# Patient Record
Sex: Male | Born: 1995
Health system: Southern US, Community
[De-identification: ages and names within clinical notes are randomized; demographics above are authoritative.]

## PROBLEM LIST (undated history)

## (undated) DIAGNOSIS — Z789 Other specified health status: Secondary | ICD-10-CM

## (undated) DIAGNOSIS — F329 Major depressive disorder, single episode, unspecified: Secondary | ICD-10-CM

## (undated) HISTORY — PX: HERNIA REPAIR: SHX51

## (undated) HISTORY — PX: INGUINAL HERNIA REPAIR: SUR1180

---
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
## 2017-03-24 ENCOUNTER — Encounter: Payer: Self-pay | Admitting: *Deleted

## 2017-03-24 ENCOUNTER — Emergency Department (INDEPENDENT_AMBULATORY_CARE_PROVIDER_SITE_OTHER)
Admission: EM | Admit: 2017-03-24 | Discharge: 2017-03-24 | Disposition: A | Discharge status: Home / Self Care | Payer: BLUE CROSS/BLUE SHIELD | Source: Home / Self Care | Attending: Family Medicine | Admitting: Family Medicine

## 2017-03-24 DIAGNOSIS — B354 Tinea corporis: Secondary | ICD-10-CM

## 2017-03-24 MED ORDER — KETOCONAZOLE 2 % EX CREA: 1.0000 "application " | TOPICAL_CREAM | Freq: Every day | CUTANEOUS | 0 refills | Status: DC

## 2017-03-24 NOTE — ED Triage Notes (Signed)
Pt c/o rash on his LT chest x 1- 2 mths.

## 2017-03-24 NOTE — ED Provider Notes (Signed)
[facility]    CSN: [id number] Arrival date & time: 03/24/17  1328     History   Chief Complaint Chief Complaint  Patient presents with  . Rash    HPI Tyler Lewis is a 21 y.o. male.   Patient complains of a slightly pruritic rash on his upper chest for about two months.  There was no response to an OTC anti-fungal spray.   The history is provided by the patient.  Rash  Location: anterior chest. Quality: dryness, itchiness and redness   Quality: not blistering, not bruising, not burning, not draining, not painful, not peeling, not scaling, not swelling and not weeping   Severity:  Mild Onset quality:  Gradual Duration:  2 months Timing:  Constant Progression:  Unchanged Chronicity:  New Context: not animal contact, not chemical exposure, not exposure to similar rash, not food, not hot tub use, not insect bite/sting, not medications, not new detergent/soap, not nuts and not plant contact   Relieved by:  Nothing Worsened by:  Nothing Ineffective treatments:  Anti-fungal cream Associated symptoms: no fatigue, no fever, no induration, no myalgias and no sore throat     Past Medical History:  Diagnosis Date  . Medical history non-contributory     Patient Active Problem List   Diagnosis Date Noted  . Anxiety and depression 08/29/2015  . Tear of right glenoid labrum 03/03/2015  . NEOPLASM UNCERTAIN BHV LIP ORAL CAVITY&PHARYNX 06/13/2011    Past Surgical History:  Procedure Laterality Date  . HERNIA REPAIR    . INGUINAL HERNIA REPAIR    . SHOULDER ARTHROSCOPY Right 07/10/2015   Procedure: Right diagnostic shoulder arthroscopy with capsular debridement;  Surgeon: Tania Ade, MD;  Location: [facility];  Service: Orthopedics;  Laterality: Right;       Home Medications    Prior to Admission medications   Medication Sig Start Date End Date Taking? Authorizing Provider  fexofenadine (ALLEGRA) 180 MG tablet Take 180 mg by mouth  daily.   Yes [provider]  ketoconazole (NIZORAL) 2 % cream Apply 1 application topically daily. 03/24/17   Kandra Nicolas, MD    Family History Family History  Problem Relation Age of Onset  . Hypertension Maternal Grandmother   . Heart disease Maternal Grandmother   . Hypertension Maternal Grandfather     Social History Social History  Substance Use Topics  . Smoking status: Never Smoker  . Smokeless tobacco: Never Used  . Alcohol use No     Allergies   Dust mite extract   Review of Systems Review of Systems  Constitutional: Negative for fatigue and fever.  HENT: Negative for sore throat.   Musculoskeletal: Negative for myalgias.  Skin: Positive for rash.  All other systems reviewed and are negative.    Physical Exam Triage Vital Signs ED Triage Vitals  Enc Vitals Group     BP 03/24/17 1411 117/64     Pulse Rate 03/24/17 1411 74     Resp 03/24/17 1411 16     Temp 03/24/17 1411 98.5 F (36.9 C)     Temp Source 03/24/17 1411 Oral     SpO2 03/24/17 1411 100 %     Weight 03/24/17 1412 154 lb (69.9 kg)     Height --      Head Circumference --      Peak Flow --      Pain Score 03/24/17 1413 0     Pain Loc --  Pain Edu? --      Excl. in [place]? --    No data found.   Updated Vital Signs BP 117/64 (BP Location: Left Arm)   Pulse 74   Temp 98.5 F (36.9 C) (Oral)   Resp 16   Wt 154 lb (69.9 kg)   SpO2 100%   Visual Acuity Right Eye Distance:   Left Eye Distance:   Bilateral Distance:    Right Eye Near:   Left Eye Near:    Bilateral Near:     Physical Exam  Constitutional: He appears well-developed and well-nourished. No distress.  HENT:  Head: Normocephalic.  Right Ear: External ear normal.  Left Ear: External ear normal.  Nose: Nose normal.  Mouth/Throat: Oropharynx is clear and moist.  Eyes: Pupils are equal, round, and reactive to light.  Neck: Neck supple.  Cardiovascular: Normal rate.   Pulmonary/Chest: Effort normal.     Anterior chest reveals six macular small annular lesions about 1cm diameter with central clearing.  No erythema.  Lymphadenopathy:    He has no cervical adenopathy.  Neurological: He is alert.  Skin: Skin is warm and dry.  Nursing note and vitals reviewed.    UC Treatments / Results  Labs (all labs ordered are listed, but only abnormal results are displayed) Labs Reviewed - No data to display  EKG  EKG Interpretation None       Radiology No results found.  Procedures Procedures (including critical care time)  Medications Ordered in UC Medications - No data to display   Initial Impression / Assessment and Plan / UC Course  I have reviewed the triage vital signs and the nursing notes.  Pertinent labs & imaging results that were available during my care of the patient were reviewed by me and considered in my medical decision making (see chart for details).    Begin Nizoral cream once daily. Followup with dermatologist if not improving 2 weeks.    Final Clinical Impressions(s) / UC Diagnoses   Final diagnoses:  Tinea corporis    New Prescriptions New Prescriptions   KETOCONAZOLE (NIZORAL) 2 % CREAM    Apply 1 application topically daily.     Kandra Nicolas, MD 03/25/17 [phone]

## 2017-10-08 ENCOUNTER — Encounter: Payer: Self-pay | Admitting: Sports Medicine

## 2017-10-08 ENCOUNTER — Ambulatory Visit (INDEPENDENT_AMBULATORY_CARE_PROVIDER_SITE_OTHER): Payer: BLUE CROSS/BLUE SHIELD | Admitting: Sports Medicine

## 2017-10-08 DIAGNOSIS — F329 Major depressive disorder, single episode, unspecified: Secondary | ICD-10-CM

## 2017-10-08 DIAGNOSIS — Z0000 Encounter for general adult medical examination without abnormal findings: Secondary | ICD-10-CM

## 2017-10-08 DIAGNOSIS — F419 Anxiety disorder, unspecified: Secondary | ICD-10-CM | POA: Diagnosis not present

## 2017-10-08 DIAGNOSIS — F32A Depression, unspecified: Secondary | ICD-10-CM

## 2017-10-08 NOTE — Assessment & Plan Note (Signed)
Checking routine labs 

## 2017-10-08 NOTE — Assessment & Plan Note (Signed)
Girlfriend issues. No medication needed. I would like him to touch base with behavioral therapy downstairs.

## 2017-10-08 NOTE — Progress Notes (Signed)
  Subjective:    CC: Mood issues  HPI: Tyler Lewis is a 21 year old male, he is in college.  He is having some girlfriend issues, broke up with one girlfriend, is trying to talk to another girl but she will not respond to him.  No suicidal or homicidal ideation.  Agreeable to discuss this with behavioral therapy.  Preventive measures: Needs some routine blood work.  Past medical history:  Negative.  See flowsheet/record as well for more information.  Surgical history: Negative.  See flowsheet/record as well for more information.  Family history: Negative.  See flowsheet/record as well for more information.  Social history: Negative.  See flowsheet/record as well for more information.  Allergies, and medications have been entered into the medical record, reviewed, and no changes needed.   (To billers/coders, pertinent past medical, social, surgical, family history can be found in problem list, if problem list is marked as reviewed then this indicates that past medical, social, surgical, family history was also reviewed)  Review of Systems: No fevers, chills, night sweats, weight loss, chest pain, or shortness of breath.   Objective:    General: Well Developed, well nourished, and in no acute distress.  Neuro: Alert and oriented x3, extra-ocular muscles intact, sensation grossly intact.  HEENT: Normocephalic, atraumatic, pupils equal round reactive to light, neck supple, no masses, no lymphadenopathy, thyroid nonpalpable.  Skin: Warm and dry, no rashes. Cardiac: Regular rate and rhythm, no murmurs rubs or gallops, no lower extremity edema.  Respiratory: Clear to auscultation bilaterally. Not using accessory muscles, speaking in full sentences.  Impression and Recommendations:    Anxiety and depression Girlfriend issues. No medication needed. I would like him to touch base with behavioral therapy downstairs.  Annual physical exam Checking routine labs.  I spent 40 minutes with this  patient, greater than 50% was face-to-face time counseling regarding the above diagnoses ___________________________________________ Gwen Her. Dianah Field, M.D., ABFM., CAQSM. Primary Care and [place] Instructor of [place] [employer] [place] [employer] [facility] [employer]

## 2017-11-27 DIAGNOSIS — L700 Acne vulgaris: Secondary | ICD-10-CM | POA: Diagnosis not present

## 2018-05-19 DIAGNOSIS — L700 Acne vulgaris: Secondary | ICD-10-CM | POA: Diagnosis not present

## 2018-05-19 DIAGNOSIS — Z79899 Other long term (current) drug therapy: Secondary | ICD-10-CM | POA: Diagnosis not present

## 2018-07-09 DIAGNOSIS — Z23 Encounter for immunization: Secondary | ICD-10-CM | POA: Diagnosis not present

## 2018-08-14 DIAGNOSIS — Z630 Problems in relationship with spouse or partner: Secondary | ICD-10-CM | POA: Diagnosis not present

## 2018-08-14 DIAGNOSIS — F4321 Adjustment disorder with depressed mood: Secondary | ICD-10-CM | POA: Diagnosis not present

## 2018-08-21 DIAGNOSIS — F4321 Adjustment disorder with depressed mood: Secondary | ICD-10-CM | POA: Diagnosis not present

## 2018-08-21 DIAGNOSIS — Z630 Problems in relationship with spouse or partner: Secondary | ICD-10-CM | POA: Diagnosis not present

## 2018-08-28 DIAGNOSIS — Z630 Problems in relationship with spouse or partner: Secondary | ICD-10-CM | POA: Diagnosis not present

## 2018-08-28 DIAGNOSIS — F4321 Adjustment disorder with depressed mood: Secondary | ICD-10-CM | POA: Diagnosis not present

## 2018-09-04 DIAGNOSIS — Z630 Problems in relationship with spouse or partner: Secondary | ICD-10-CM | POA: Diagnosis not present

## 2018-09-04 DIAGNOSIS — F4321 Adjustment disorder with depressed mood: Secondary | ICD-10-CM | POA: Diagnosis not present

## 2018-09-11 DIAGNOSIS — Z630 Problems in relationship with spouse or partner: Secondary | ICD-10-CM | POA: Diagnosis not present

## 2018-09-11 DIAGNOSIS — F4321 Adjustment disorder with depressed mood: Secondary | ICD-10-CM | POA: Diagnosis not present

## 2018-10-02 DIAGNOSIS — F4321 Adjustment disorder with depressed mood: Secondary | ICD-10-CM | POA: Diagnosis not present

## 2018-10-02 DIAGNOSIS — Z630 Problems in relationship with spouse or partner: Secondary | ICD-10-CM | POA: Diagnosis not present

## 2018-10-09 DIAGNOSIS — Z630 Problems in relationship with spouse or partner: Secondary | ICD-10-CM | POA: Diagnosis not present

## 2018-10-09 DIAGNOSIS — F4321 Adjustment disorder with depressed mood: Secondary | ICD-10-CM | POA: Diagnosis not present

## 2019-07-15 ENCOUNTER — Encounter: Payer: Self-pay | Admitting: Family Medicine

## 2019-07-15 ENCOUNTER — Other Ambulatory Visit: Payer: Self-pay

## 2019-07-15 ENCOUNTER — Ambulatory Visit (INDEPENDENT_AMBULATORY_CARE_PROVIDER_SITE_OTHER): Payer: BC Managed Care – PPO | Admitting: Family Medicine

## 2019-07-15 DIAGNOSIS — F419 Anxiety disorder, unspecified: Secondary | ICD-10-CM

## 2019-07-15 DIAGNOSIS — F329 Major depressive disorder, single episode, unspecified: Secondary | ICD-10-CM

## 2019-07-15 MED ORDER — FLUOXETINE HCL 10 MG PO CAPS: ORAL_CAPSULE | ORAL | 0 refills | Status: DC

## 2019-07-15 NOTE — Progress Notes (Signed)
Virtual Visit  I connected with      Tyler Lewis  by a telemedicine application and verified that I am speaking with the correct person using two identifiers.   I discussed the limitations of evaluation and management by telemedicine and the availability of in person appointments. The patient expressed understanding and agreed to proceed.  History of Present Illness: Tyler Lewis is a 23 y.o. male who would like to discuss depression.  Patient has a history of previous episodes of depression that are typically transient.  However he has had increased stressors recently.  He has been going to school from home with a heavy workload which he finds challenging.  Additionally his girlfriend recently broke up with him which he finds very stressful.  He notes his mood has worsened and is interested in medications.  Previously he has been reluctant to consider medicine but his mood is worse now and he is willing to consider it.  He has had previous episodes of counseling which he found to be not helpful at all.  He has a strong family history for anxiety and depression in his mother father and sister.  He does not take medicine but cannot remember exactly what medicine they do take.   Observations/Objective: There were no vitals taken for this visit. Wt Readings from Last 5 Encounters:  10/08/17 156 lb (70.8 kg) (52 %, Z= 0.04)*  03/24/17 154 lb (69.9 kg) (51 %, Z= 0.03)*  11/25/15 143 lb 4 oz (65 kg) (43 %, Z= -0.18)*  10/02/15 141 lb 9.6 oz (64.2 kg) (41 %, Z= -0.22)*  08/29/15 135 lb 9.6 oz (61.5 kg) (31 %, Z= -0.48)*   * Growth percentiles are based on CDC (Boys, 2-20 Years) data.   Exam: Normal Speech.  Psych: Alert and oriented normal speech thought process and verbal affect.  No active SI or HI.    Lab and Radiology Results No results found for this or any previous visit (from the past 72 hour(s)). No results found.   Assessment and Plan: 23 y.o. male with anxiety and  depression.  Exacerbation of likely fundamental mild underlying depression and anxiety due to increased stressors.  Discussed options.  Patient is reluctant to consider counseling.  However he is now willing to consider medication.  I think that is reasonable at this point given the more severe nature of his symptoms.  Plan to proceed with Prozac.  Start 10 mg increase to 20 mg 1 week.  Follow-up in 2 weeks with PCP.  Appointment already scheduled for October 5.  Precautions reviewed.  Recheck sooner if needed.  PDMP not reviewed this encounter. No orders of the defined types were placed in this encounter.  Meds ordered this encounter  Medications  . FLUoxetine (PROZAC) 10 MG capsule    Sig: Take 1 capsule (10 mg total) by mouth daily for 7 days, THEN 2 capsules (20 mg total) daily for 28 days.    Dispense:  63 capsule    Refill:  0    Follow Up Instructions:    I discussed the assessment and treatment plan with the patient. The patient was provided an opportunity to ask questions and all were answered. The patient agreed with the plan and demonstrated an understanding of the instructions.   The patient was advised to call back or seek an in-person evaluation if the symptoms worsen or if the condition fails to improve as anticipated.  Time: 15 minutes of intraservice time, with >22 minutes  of total time during today's visit.      Historical information moved to improve visibility of documentation.  Past Medical History:  Diagnosis Date  . Anxiety and depression 08/29/2015   08/29/2015 PHQ9 = 16, GAD7 = 14 10/02/2015 PHQ9 = 18, GAD7 = 15   . Medical history non-contributory    Past Surgical History:  Procedure Laterality Date  . HERNIA REPAIR    . INGUINAL HERNIA REPAIR    . SHOULDER ARTHROSCOPY Right 07/10/2015   Procedure: Right diagnostic shoulder arthroscopy with capsular debridement;  Surgeon: Tania Ade, MD;  Location: [facility];  Service: Orthopedics;   Laterality: Right;   Social History   Tobacco Use  . Smoking status: Never Smoker  . Smokeless tobacco: Never Used  Substance Use Topics  . Alcohol use: No   family history includes Anxiety disorder in his father, mother, and sister; Depression in his father, mother, and sister; Heart disease in his maternal grandmother; Hypertension in his maternal grandfather and maternal grandmother.  Medications: Current Outpatient Medications  Medication Sig Dispense Refill  . FLUoxetine (PROZAC) 10 MG capsule Take 1 capsule (10 mg total) by mouth daily for 7 days, THEN 2 capsules (20 mg total) daily for 28 days. 63 capsule 0   No current facility-administered medications for this visit.    Allergies  Allergen Reactions  . Dust Mite Extract

## 2019-07-22 DIAGNOSIS — R45851 Suicidal ideations: Secondary | ICD-10-CM | POA: Diagnosis not present

## 2019-07-22 DIAGNOSIS — Z5321 Procedure and treatment not carried out due to patient leaving prior to being seen by health care provider: Secondary | ICD-10-CM | POA: Diagnosis not present

## 2019-07-23 DIAGNOSIS — F4322 Adjustment disorder with anxiety: Secondary | ICD-10-CM | POA: Diagnosis not present

## 2019-07-26 ENCOUNTER — Ambulatory Visit (INDEPENDENT_AMBULATORY_CARE_PROVIDER_SITE_OTHER): Payer: BC Managed Care – PPO | Admitting: Sports Medicine

## 2019-07-26 DIAGNOSIS — F419 Anxiety disorder, unspecified: Secondary | ICD-10-CM

## 2019-07-26 DIAGNOSIS — F329 Major depressive disorder, single episode, unspecified: Secondary | ICD-10-CM | POA: Diagnosis not present

## 2019-07-26 DIAGNOSIS — F32A Depression, unspecified: Secondary | ICD-10-CM

## 2019-07-26 NOTE — Assessment & Plan Note (Addendum)
Worsening adjustment disorder, only had prozac 20 for 2 days now. Currently getting behavioral therapy twice a week. This all stems from the same girlfriend breaking up with him that we discussed 2 years ago.

## 2019-07-26 NOTE — Progress Notes (Signed)
Virtual Visit via WebEx/MyChart   I connected with  Launa Grill  on 07/26/19 via WebEx/MyChart/Doximity Video and verified that I am speaking with the correct person using two identifiers.   I discussed the limitations, risks, security and privacy concerns of performing an evaluation and management service by WebEx/MyChart/Doximity Video, including the higher likelihood of inaccurate diagnosis and treatment, and the availability of in person appointments.  We also discussed the likely need of an additional face to face encounter for complete and high quality delivery of care.  I also discussed with the patient that there may be a patient responsible charge related to this service. The patient expressed understanding and wishes to proceed.  Provider location is either at home or medical facility. Patient location is at their home, different from provider location. People involved in care of the patient during this telehealth encounter were myself, my nurse/medical assistant, and my front office/scheduling team member.  Subjective:    CC: Mood disorder  HPI: This is a pleasant 23 year old male, he is being treated for anxiety and depression, 2 years ago we treated him for anxiety and depression/adjustment disorder after he had a break-up with his girlfriend.  He did well.  More recently he has broken up with the same girlfriend, he is having anxiety and depressive symptoms, likely another adjustment disorder, he is currently doing behavioral therapy twice a week and he started low-dose Prozac, has only been on the 20 mg dosage form for 2 days now.  No suicidal or homicidal ideation.  I reviewed the past medical history, family history, social history, surgical history, and allergies today and no changes were needed.  Please see the problem list section below in epic for further details.  Past Medical History: Past Medical History:  Diagnosis Date  . Anxiety and depression 08/29/2015   08/29/2015 PHQ9 = 16, GAD7 = 14 10/02/2015 PHQ9 = 18, GAD7 = 15   . Medical history non-contributory    Past Surgical History: Past Surgical History:  Procedure Laterality Date  . HERNIA REPAIR    . INGUINAL HERNIA REPAIR    . SHOULDER ARTHROSCOPY Right 07/10/2015   Procedure: Right diagnostic shoulder arthroscopy with capsular debridement;  Surgeon: Tania Ade, MD;  Location: [place];  Service: Orthopedics;  Laterality: Right;   Social History: Social History   Socioeconomic History  . Marital status: Single    Spouse name: Not on file  . Number of children: Not on file  . Years of education: Not on file  . Highest education level: Not on file  Occupational History  . Not on file  Social Needs  . Financial resource strain: Not on file  . Food insecurity    Worry: Not on file    Inability: Not on file  . Transportation needs    Medical: Not on file    Non-medical: Not on file  Tobacco Use  . Smoking status: Never Smoker  . Smokeless tobacco: Never Used  Substance and Sexual Activity  . Alcohol use: No  . Drug use: No  . Sexual activity: Not on file  Lifestyle  . Physical activity    Days per week: Not on file    Minutes per session: Not on file  . Stress: Not on file  Relationships  . Social Herbalist on phone: Not on file    Gets together: Not on file    Attends religious service: Not on file    Active member of  club or organization: Not on file    Attends meetings of clubs or organizations: Not on file    Relationship status: Not on file  Other Topics Concern  . Not on file  Social History Narrative  . Not on file   Family History: Family History  Problem Relation Age of Onset  . Hypertension Maternal Grandmother   . Heart disease Maternal Grandmother   . Hypertension Maternal Grandfather   . Depression Mother   . Anxiety disorder Mother   . Depression Father   . Anxiety disorder Father   . Depression Sister   .  Anxiety disorder Sister    Allergies: Allergies  Allergen Reactions  . Dust Mite Extract    Medications: See med rec.  Review of Systems: No fevers, chills, night sweats, weight loss, chest pain, or shortness of breath.   Objective:    General: Speaking full sentences, no audible heavy breathing.  Sounds alert and appropriately interactive.  Appears well.  Face symmetric.  Extraocular movements intact.  Pupils equal and round.  No nasal flaring or accessory muscle use visualized.  No other physical exam performed due to the non-physical nature of this visit.  Impression and Recommendations:    Anxiety and depression Worsening adjustment disorder, only had prozac 20 for 2 days now. Currently getting behavioral therapy twice a week. This all stems from the same girlfriend breaking up with him that we discussed 2 years ago.   I discussed the above assessment and treatment plan with the patient. The patient was provided an opportunity to ask questions and all were answered. The patient agreed with the plan and demonstrated an understanding of the instructions.   The patient was advised to call back or seek an in-person evaluation if the symptoms worsen or if the condition fails to improve as anticipated.   I provided 25 minutes of non-face-to-face time during this encounter, 15 minutes of additional time was needed to gather information, review chart, records, communicate/coordinate with staff remotely, troubleshooting the multiple errors that we get every time when trying to do video calls through the electronic medical record, WebEx, and Doximity, restart the encounter multiple times due to instability of the software, as well as complete documentation.   ___________________________________________ Gwen Her. Dianah Field, M.D., ABFM., CAQSM. Primary Care and Sports Medicine [facility]  Adjunct Professor of [employer] [facility]  of Medicine

## 2019-07-28 DIAGNOSIS — F4322 Adjustment disorder with anxiety: Secondary | ICD-10-CM | POA: Diagnosis not present

## 2019-07-30 DIAGNOSIS — F4322 Adjustment disorder with anxiety: Secondary | ICD-10-CM | POA: Diagnosis not present

## 2019-08-03 DIAGNOSIS — F4322 Adjustment disorder with anxiety: Secondary | ICD-10-CM | POA: Diagnosis not present

## 2019-08-06 DIAGNOSIS — F4322 Adjustment disorder with anxiety: Secondary | ICD-10-CM | POA: Diagnosis not present

## 2019-08-10 ENCOUNTER — Other Ambulatory Visit: Payer: Self-pay | Admitting: Family Medicine

## 2019-08-10 DIAGNOSIS — F4322 Adjustment disorder with anxiety: Secondary | ICD-10-CM | POA: Diagnosis not present

## 2019-08-13 DIAGNOSIS — F4322 Adjustment disorder with anxiety: Secondary | ICD-10-CM | POA: Diagnosis not present

## 2019-08-17 DIAGNOSIS — F4322 Adjustment disorder with anxiety: Secondary | ICD-10-CM | POA: Diagnosis not present

## 2019-08-20 DIAGNOSIS — F4322 Adjustment disorder with anxiety: Secondary | ICD-10-CM | POA: Diagnosis not present

## 2019-08-24 DIAGNOSIS — F4322 Adjustment disorder with anxiety: Secondary | ICD-10-CM | POA: Diagnosis not present

## 2019-08-27 DIAGNOSIS — F4322 Adjustment disorder with anxiety: Secondary | ICD-10-CM | POA: Diagnosis not present

## 2019-09-03 DIAGNOSIS — F4322 Adjustment disorder with anxiety: Secondary | ICD-10-CM | POA: Diagnosis not present

## 2019-09-04 ENCOUNTER — Other Ambulatory Visit: Payer: Self-pay | Admitting: Family Medicine

## 2019-09-09 DIAGNOSIS — F4322 Adjustment disorder with anxiety: Secondary | ICD-10-CM | POA: Diagnosis not present

## 2019-09-13 DIAGNOSIS — F4322 Adjustment disorder with anxiety: Secondary | ICD-10-CM | POA: Diagnosis not present

## 2019-09-21 DIAGNOSIS — F4322 Adjustment disorder with anxiety: Secondary | ICD-10-CM | POA: Diagnosis not present

## 2019-09-28 DIAGNOSIS — F4322 Adjustment disorder with anxiety: Secondary | ICD-10-CM | POA: Diagnosis not present

## 2019-10-01 DIAGNOSIS — F4322 Adjustment disorder with anxiety: Secondary | ICD-10-CM | POA: Diagnosis not present

## 2019-10-05 DIAGNOSIS — F4322 Adjustment disorder with anxiety: Secondary | ICD-10-CM | POA: Diagnosis not present

## 2019-10-08 DIAGNOSIS — F4322 Adjustment disorder with anxiety: Secondary | ICD-10-CM | POA: Diagnosis not present

## 2019-10-12 DIAGNOSIS — F4322 Adjustment disorder with anxiety: Secondary | ICD-10-CM | POA: Diagnosis not present

## 2019-10-19 DIAGNOSIS — F4322 Adjustment disorder with anxiety: Secondary | ICD-10-CM | POA: Diagnosis not present

## 2020-01-19 DIAGNOSIS — F4322 Adjustment disorder with anxiety: Secondary | ICD-10-CM | POA: Diagnosis not present

## 2020-01-30 DIAGNOSIS — F4322 Adjustment disorder with anxiety: Secondary | ICD-10-CM | POA: Diagnosis not present
